# Patient Record
Sex: Male | Born: 2000
Health system: Southern US, Community
[De-identification: ages and names within clinical notes are randomized; demographics above are authoritative.]

## PROBLEM LIST (undated history)

## (undated) DIAGNOSIS — F32A Depression, unspecified: Secondary | ICD-10-CM

## (undated) HISTORY — DX: Depression, unspecified: F32.A

---
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
## 2019-01-11 IMAGING — DX DG KNEE COMPLETE 4+V*L*
4 series · 4 of 4 positions shown · non-contrast
Comparison: None.

CLINICAL DATA: Injuring left knee during wrestling match yesterday
with pain.

EXAM:
LEFT KNEE - COMPLETE 4+ VIEW

[knee ap]
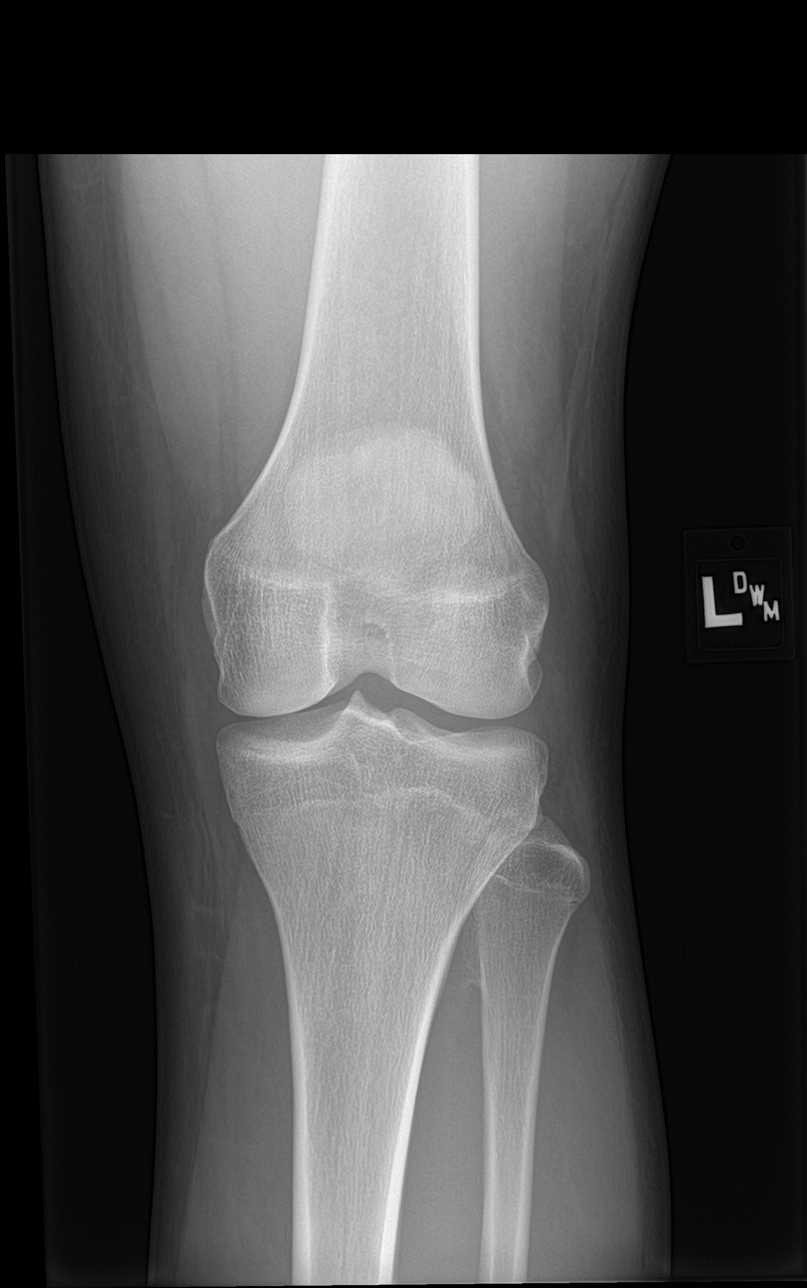

[knee lat]
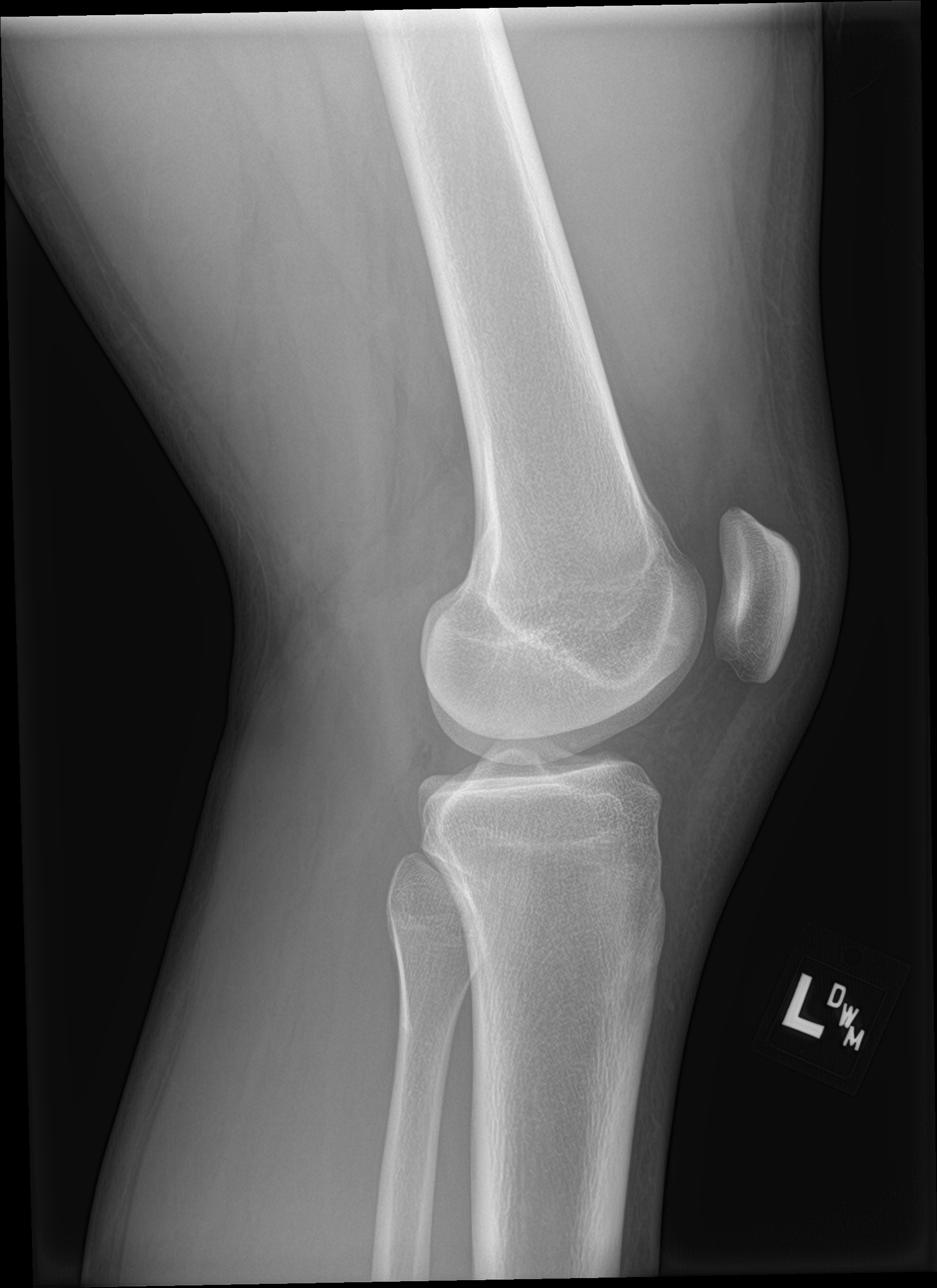

[knee obl (1 of 2)]
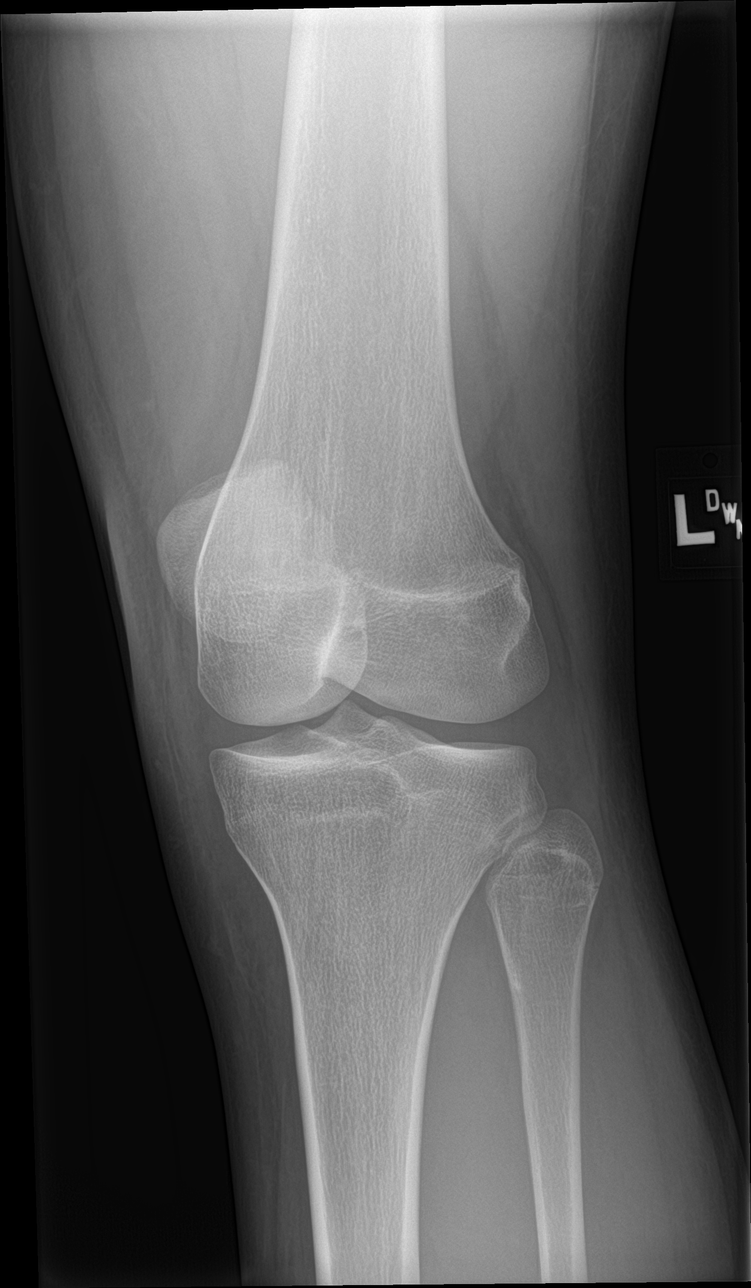

[knee obl (2 of 2)]
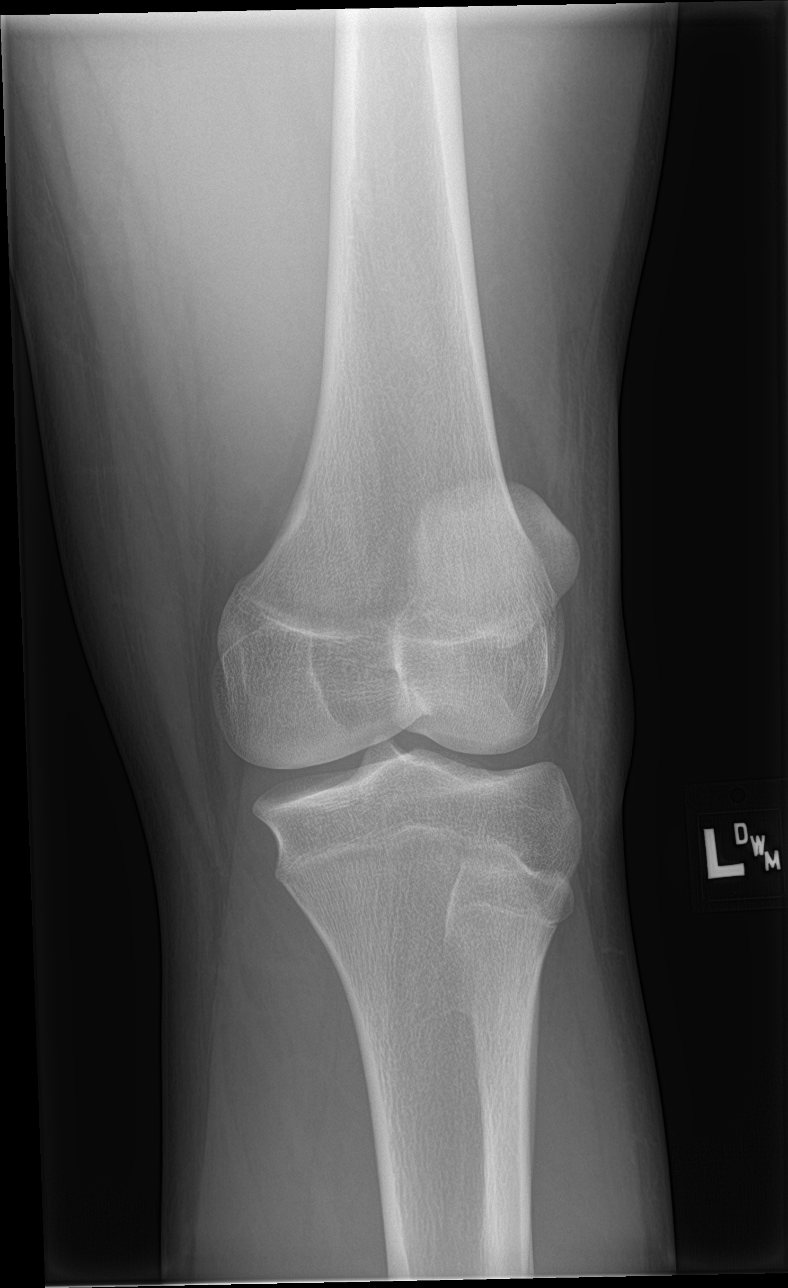

[4 of 4 positions shown; findings below may reference images not displayed]

FINDINGS: No evidence of fracture, dislocation, or joint effusion. No evidence
of arthropathy or other focal bone abnormality. Soft tissues are
unremarkable.
IMPRESSION: Negative.

## 2020-02-26 ENCOUNTER — Encounter: Payer: Self-pay | Admitting: Emergency Medicine

## 2020-02-26 ENCOUNTER — Emergency Department (INDEPENDENT_AMBULATORY_CARE_PROVIDER_SITE_OTHER)
Admission: EM | Admit: 2020-02-26 | Discharge: 2020-02-26 | Disposition: A | Discharge status: Home / Self Care | Payer: 59 | Source: Home / Self Care

## 2020-02-26 ENCOUNTER — Emergency Department: Admission: EM | Admit: 2020-02-26 | Discharge status: Absent without leave | Payer: Self-pay | Source: Home / Self Care

## 2020-02-26 DIAGNOSIS — R110 Nausea: Secondary | ICD-10-CM | POA: Diagnosis not present

## 2020-02-26 DIAGNOSIS — K529 Noninfective gastroenteritis and colitis, unspecified: Secondary | ICD-10-CM | POA: Diagnosis not present

## 2020-02-26 LAB — POCT CBC W AUTO DIFF (K'VILLE URGENT CARE)

## 2020-02-26 MED ORDER — ONDANSETRON 4 MG PO TBDP: 4.0000 mg | ORAL_TABLET | Freq: Three times a day (TID) | ORAL | 0 refills | Status: AC | PRN

## 2020-02-26 MED ORDER — OMEPRAZOLE 20 MG PO CPDR: 20.0000 mg | DELAYED_RELEASE_CAPSULE | Freq: Every day | ORAL | 0 refills | Status: AC

## 2020-02-26 NOTE — ED Triage Notes (Signed)
Patient c/o feeling nausea in the morning for the past 5 days.  Patient has not had an appetite which makes the nausea worse.  There is a burning sensation in the bottom of his stomach, no vomiting.  Patient did have a colonoscopy in April, had 2 small ulcers and inflammation.

## 2020-02-26 NOTE — ED Provider Notes (Signed)
[name]    CSN: [id number] Arrival date & time: 02/26/20  9629      History   Chief Complaint Chief Complaint  Patient presents with  . Nausea    HPI Robert [name] is a 19 y.o. male.   HPI  Robert [name] is a 19 y.o. male presenting to UC with c/o nausea that started 5 days ago and decreased appetite. Hx of chronic diarrhea. He had a colonoscopy in April 2021, found to have 2 small ulcers and inflammation but was not prescribed any medication. He was being worked up for possible IBS. Pt states due to his schedule, he has not been able to make a f/u appointment with GI. Denies fever, chills or vomiting. No urinary symptoms. No sick contacts or recent travel. He has not tried anything for his symptoms.    History reviewed. No pertinent past medical history.  There are no problems to display for this patient.   History reviewed. No pertinent surgical history.     Home Medications    Prior to Admission medications   Medication Sig Start Date End Date Taking? Authorizing Provider  omeprazole (PRILOSEC) 20 MG capsule Take 1 capsule (20 mg total) by mouth daily. 02/26/20   Lurene Shadow, PA-C  ondansetron (ZOFRAN-ODT) 4 MG disintegrating tablet Take 1 tablet (4 mg total) by mouth every 8 (eight) hours as needed for nausea or vomiting. 02/26/20   Lurene Shadow, PA-C    Family History No family history on file.  Social History Social History   Tobacco Use  . Smoking status: Never Smoker  . Smokeless tobacco: Never Used  Substance Use Topics  . Alcohol use: No  . Drug use: Not on file     Allergies   Penicillins and Sulfa antibiotics   Review of Systems Review of Systems  Constitutional: Positive for appetite change. Negative for chills, fatigue and fever.  Gastrointestinal: Positive for abdominal pain (mild diffuse cramping), diarrhea and nausea. Negative for blood in stool and vomiting.  Neurological: Negative for dizziness,  light-headedness and headaches.     Physical Exam Triage Vital Signs ED Triage Vitals [02/26/20 0954]  Enc Vitals Group     BP (!) 147/82     Pulse Rate (!) 55     Resp      Temp      Temp src      SpO2 97 %     Weight      Height      Head Circumference      Peak Flow      Pain Score 0     Pain Loc      Pain Edu?      Excl. in GC?    No data found.  Updated Vital Signs BP (!) 147/82 (BP Location: Left Arm)   Pulse (!) 55   SpO2 97%   Visual Acuity Right Eye Distance:   Left Eye Distance:   Bilateral Distance:    Right Eye Near:   Left Eye Near:    Bilateral Near:     Physical Exam Vitals and nursing note reviewed.  Constitutional:      General: He is not in acute distress.    Appearance: Normal appearance. He is well-developed. He is obese. He is not ill-appearing, toxic-appearing or diaphoretic.  HENT:     Head: Normocephalic and atraumatic.     Right Ear: Tympanic membrane and ear canal normal.     Left Ear: Tympanic membrane  and ear canal normal.     Nose: Nose normal.     Mouth/Throat:     Mouth: Mucous membranes are moist.     Pharynx: Oropharynx is clear.  Cardiovascular:     Rate and Rhythm: Regular rhythm. Bradycardia present.  Pulmonary:     Effort: Pulmonary effort is normal. No respiratory distress.     Breath sounds: Normal breath sounds. No stridor. No wheezing, rhonchi or rales.  Abdominal:     General: There is no distension.     Palpations: Abdomen is soft.     Tenderness: There is no abdominal tenderness. There is no right CVA tenderness or left CVA tenderness.  Musculoskeletal:        General: Normal range of motion.     Cervical back: Normal range of motion.  Skin:    General: Skin is warm and dry.  Neurological:     Mental Status: He is alert and oriented to person, place, and time.  Psychiatric:        Behavior: Behavior normal.      UC Treatments / Results  Labs (all labs ordered are listed, but only abnormal results  are displayed) Labs Reviewed  COMPLETE METABOLIC PANEL WITH GFR  LIPASE  POCT CBC W AUTO DIFF ([facility])    EKG   Radiology No results found.  Procedures Procedures (including critical care time)  Medications Ordered in UC Medications - No data to display  Initial Impression / Assessment and Plan / UC Course  I have reviewed the triage vital signs and the nursing notes.  Pertinent labs & imaging results that were available during my care of the patient were reviewed by me and considered in my medical decision making (see chart for details).     Benign abdominal exam CBC: unremarkable CMP and Lipase pending Will have pt try trial of omeprazole and zofran Encouraged to f/u with PCP and GI Discussed symptoms that warrant emergent care in the ED. AVS given  Final Clinical Impressions(s) / UC Diagnoses   Final diagnoses:  Nausea without vomiting  Chronic diarrhea     Discharge Instructions      Call to schedule a follow up appointment with primary care as well as gastroenterology for recheck of symptoms next week.   Call 911 or have someone drive you to the hospital if symptoms significantly worsening.     ED Prescriptions    Medication Sig Dispense Auth. Provider   omeprazole (PRILOSEC) 20 MG capsule Take 1 capsule (20 mg total) by mouth daily. 30 capsule Doroteo Glassman, Randell Detter O, PA-C   ondansetron (ZOFRAN-ODT) 4 MG disintegrating tablet Take 1 tablet (4 mg total) by mouth every 8 (eight) hours as needed for nausea or vomiting. 12 tablet Lurene Shadow, [place]     PDMP not reviewed this encounter.   Lurene Shadow, [place] 02/27/20 [phone]

## 2020-02-26 NOTE — Discharge Instructions (Signed)
  Call to schedule a follow up appointment with primary care as well as gastroenterology for recheck of symptoms next week.   Call 911 or have someone drive you to the hospital if symptoms significantly worsening.

## 2020-02-27 LAB — COMPLETE METABOLIC PANEL WITH GFR
AG Ratio: 1.8 (calc) (ref 1.0–2.5)
ALT: 62 U/L — ABNORMAL HIGH (ref 9–46)
AST: 26 U/L (ref 10–40)
Albumin: 4.9 g/dL (ref 3.6–5.1)
Alkaline phosphatase (APISO): 55 U/L (ref 36–130)
BUN: 16 mg/dL (ref 7–25)
CO2: 28 mmol/L (ref 20–32)
Calcium: 10.2 mg/dL (ref 8.6–10.3)
Chloride: 103 mmol/L (ref 98–110)
Creat: 1.12 mg/dL (ref 0.60–1.35)
GFR, Est African American: 109 mL/min/{1.73_m2} (ref >=60)
GFR, Est Non African American: 94 mL/min/{1.73_m2} (ref >=60)
Globulin: 2.7 g/dL (calc) (ref 1.9–3.7)
Glucose, Bld: 97 mg/dL (ref 65–99)
Potassium: 4.8 mmol/L (ref 3.5–5.3)
Sodium: 139 mmol/L (ref 135–146)
Total Bilirubin: 0.5 mg/dL (ref 0.2–1.2)
Total Protein: 7.6 g/dL (ref 6.1–8.1)

## 2020-02-27 LAB — LIPASE: Lipase: 11 U/L (ref 7–60)

## 2020-07-20 ENCOUNTER — Emergency Department
Admission: RE | Admit: 2020-07-20 | Discharge: 2020-07-20 | Disposition: A | Discharge status: Home / Self Care | Payer: Self-pay | Source: Ambulatory Visit | Attending: Family Medicine | Admitting: Family Medicine

## 2020-07-20 ENCOUNTER — Other Ambulatory Visit: Payer: Self-pay

## 2020-07-20 ENCOUNTER — Emergency Department (INDEPENDENT_AMBULATORY_CARE_PROVIDER_SITE_OTHER): Payer: 59

## 2020-07-20 VITALS — BP 142/86 | HR 76 | Temp 98.2°F | Resp 20 | Ht 72.0 in | Wt 270.0 lb

## 2020-07-20 DIAGNOSIS — J9801 Acute bronchospasm: Secondary | ICD-10-CM | POA: Diagnosis not present

## 2020-07-20 DIAGNOSIS — R059 Cough, unspecified: Secondary | ICD-10-CM | POA: Diagnosis not present

## 2020-07-20 MED ORDER — PREDNISONE 20 MG PO TABS: ORAL_TABLET | ORAL | 0 refills | Status: DC

## 2020-07-20 MED ORDER — ALBUTEROL SULFATE HFA 108 (90 BASE) MCG/ACT IN AERS
2.0000 | INHALATION_SPRAY | Freq: Four times a day (QID) | RESPIRATORY_TRACT | 0 refills | Status: DC | PRN
Start: 2020-07-20 — End: 2022-06-26

## 2020-07-20 NOTE — Discharge Instructions (Addendum)
Take plain guaifenesin (1200mg  extended release tabs such as Mucinex) twice daily, with plenty of water, for cough and congestion.  May continue Allegra D.

## 2020-07-20 NOTE — ED Provider Notes (Signed)
[facility]    CSN: [id number] Arrival date & time: 07/20/20  1244      History   Chief Complaint Chief Complaint  Patient presents with   Cough   Nasal Congestion    HPI Robert [name] is a 20 y.o. male.   Patient complains of a persistent partly productive cough for about a month with occasional wheezing, but no shortness of breath or pleuritic pain.  He has seasonal rhinitis which began to flare up prior to onset of his cough.  He feels well otherwise and denies fevers, chills, and sweats.  He had COVID19 in late December 2021 which had resolved by mid-January 2022. He has a past history of childhood asthma.  He has a family history of asthma (mother) and father has seasonal rhinitis.  The history is provided by the patient.    History reviewed. No pertinent past medical history.  There are no problems to display for this patient.   History reviewed. No pertinent surgical history.     Home Medications    Prior to Admission medications   Medication Sig Start Date End Date Taking? Authorizing Provider  albuterol (VENTOLIN HFA) 108 (90 Base) MCG/ACT inhaler Inhale 2 puffs into the lungs every 6 (six) hours as needed for wheezing or shortness of breath. 07/20/20  Yes Kaliel Bolds, Tera Mater, MD  dextromethorphan-guaiFENesin (MUCINEX DM) 30-600 MG 12hr tablet Take 1 tablet by mouth 2 (two) times daily as needed for cough.   Yes [provider]  predniSONE (DELTASONE) 20 MG tablet Take one tab by mouth twice daily for 4 days, then one daily for 3 days. Take with food. 07/20/20  Yes Lattie Haw, MD    Family History Family History  Problem Relation Age of Onset   Crohn's disease Mother    Healthy Father     Social History Social History   Tobacco Use   Smoking status: Never Smoker   Smokeless tobacco: Never Used  Vaping Use   Vaping Use: Former   Quit date: 10/21/2019  Substance Use Topics   Alcohol use: Not Currently   Drug use:  Yes    Frequency: 7.0 times per week    Types: Marijuana    Comment: daily     Allergies   Penicillins and Sulfa antibiotics   Review of Systems Review of Systems  No sore throat + cough No pleuritic pain + wheezing + nasal congestion + post-nasal drainage No sinus pain/pressure No itchy/red eyes No earache No hemoptysis No SOB No fever/chills No nausea No vomiting No abdominal pain No diarrhea No urinary symptoms No skin rash No fatigue No myalgias No headache   Physical Exam Triage Vital Signs ED Triage Vitals  Enc Vitals Group     BP 07/20/20 1310 (!) 142/86     Pulse Rate 07/20/20 1310 76     Resp 07/20/20 1310 20     Temp 07/20/20 1310 98.2 F (36.8 C)     Temp Source 07/20/20 1310 Oral     SpO2 07/20/20 1310 98 %     Weight 07/20/20 1307 270 lb (122.5 kg)     Height 07/20/20 1307 6' (1.829 m)     Head Circumference --      Peak Flow --      Pain Score 07/20/20 1307 0     Pain Loc --      Pain Edu? --      Excl. in [place]? --    No data found.  Updated Vital Signs BP (!) 142/86    Pulse 76    Temp 98.2 F (36.8 C) (Oral)    Resp 20    Ht 6' (1.829 m)    Wt 122.5 kg    SpO2 98%    BMI 36.62 kg/m   Visual Acuity Right Eye Distance:   Left Eye Distance:   Bilateral Distance:    Right Eye Near:   Left Eye Near:    Bilateral Near:     Physical Exam Nursing notes and Vital Signs reviewed. Appearance:  Patient appears stated age, and in no acute distress Eyes:  Pupils are equal, round, and reactive to light and accomodation.  Extraocular movement is intact.  Conjunctivae are not inflamed  Ears:  Canals normal.  Tympanic membranes normal.  Nose:  Mildly congested turbinates.  No sinus tenderness.  Pharynx:  Normal Neck:  Supple.  No adenopathy.   Lungs:  Faint bilateral rhonchi with cough.  Breath sounds are equal.  Moving air well. Heart:  Regular rate and rhythm without murmurs, rubs, or gallops.  Abdomen:  Nontender without masses or  hepatosplenomegaly.  Bowel sounds are present.  No CVA or flank tenderness.  Extremities:  No edema.  Skin:  No rash present.    UC Treatments / Results  Labs (all labs ordered are listed, but only abnormal results are displayed) Labs Reviewed - No data to display  EKG   Radiology DG Chest 2 View  Result Date: 07/20/2020 CLINICAL DATA:  Cough. EXAM: CHEST - 2 VIEW COMPARISON:  None. FINDINGS: The heart size and mediastinal contours are within normal limits. Both lungs are clear. The visualized skeletal structures are unremarkable. IMPRESSION: No active cardiopulmonary disease. Electronically Signed   By: Lupita Raider M.D.   On: 07/20/2020 14:32    Procedures Procedures (including critical care time)  Medications Ordered in UC Medications - No data to display  Initial Impression / Assessment and Plan / UC Course  I have reviewed the triage vital signs and the nursing notes.  Pertinent labs & imaging results that were available during my care of the patient were reviewed by me and considered in my medical decision making (see chart for details).    Flare-up of reactive airways disease. Benign exam. Negative chest x-ray reassuring.  Begin prednisone burst/taper. Rx written for optional albuterol MDI (add if needed). Followup with Family Doctor if not improved in about 7 to 10 days.  Final Clinical Impressions(s) / UC Diagnoses   Final diagnoses:  Bronchospasm     Discharge Instructions     Take plain guaifenesin (1200mg  extended release tabs such as Mucinex) twice daily, with plenty of water, for cough and congestion.  May continue Allegra D.   ED Prescriptions    Medication Sig Dispense Auth. Provider   predniSONE (DELTASONE) 20 MG tablet Take one tab by mouth twice daily for 4 days, then one daily for 3 days. Take with food. 11 tablet , MD   albuterol (VENTOLIN HFA) 108 (90 Base) MCG/ACT inhaler Inhale 2 puffs into the lungs every 6 (six) hours as  needed for wheezing or shortness of breath. 8 g Lattie Haw, MD        Lattie Haw, MD 07/22/20 [id number]

## 2020-07-20 NOTE — ED Triage Notes (Addendum)
Pt presents to Urgent Care with c/o productive white cough and "runny nose" x approx one month. Reports having COVID in late December--symptoms subsided by mid-January before returning a month ago. Has not been vaccinated against COVID, did receive flu shot.

## 2022-04-07 IMAGING — DX DG CHEST 2V
2 series · 2 of 2 positions shown · non-contrast
Comparison: None.

CLINICAL DATA: Cough.

EXAM:
CHEST - 2 VIEW

[chest pa]
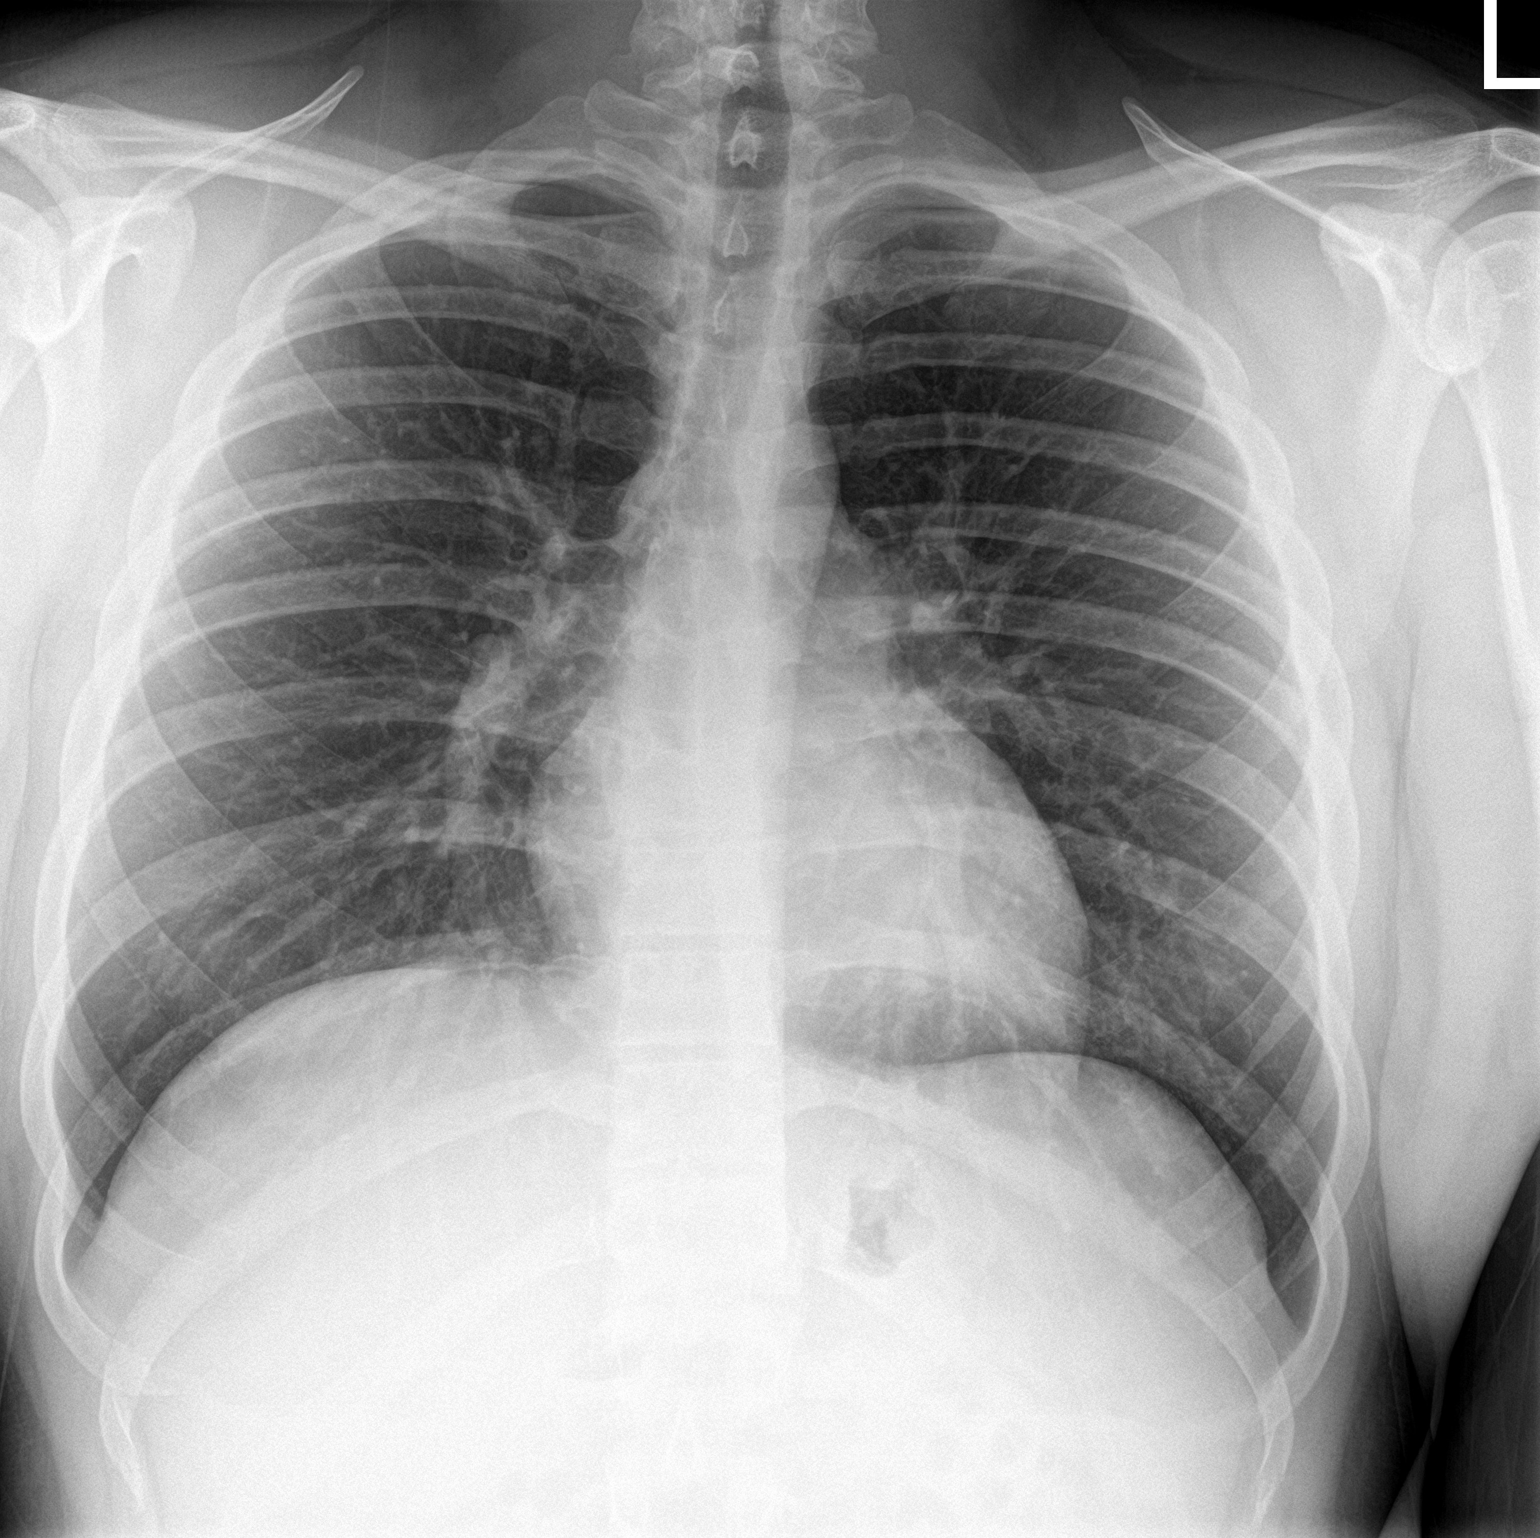

[chest lat]
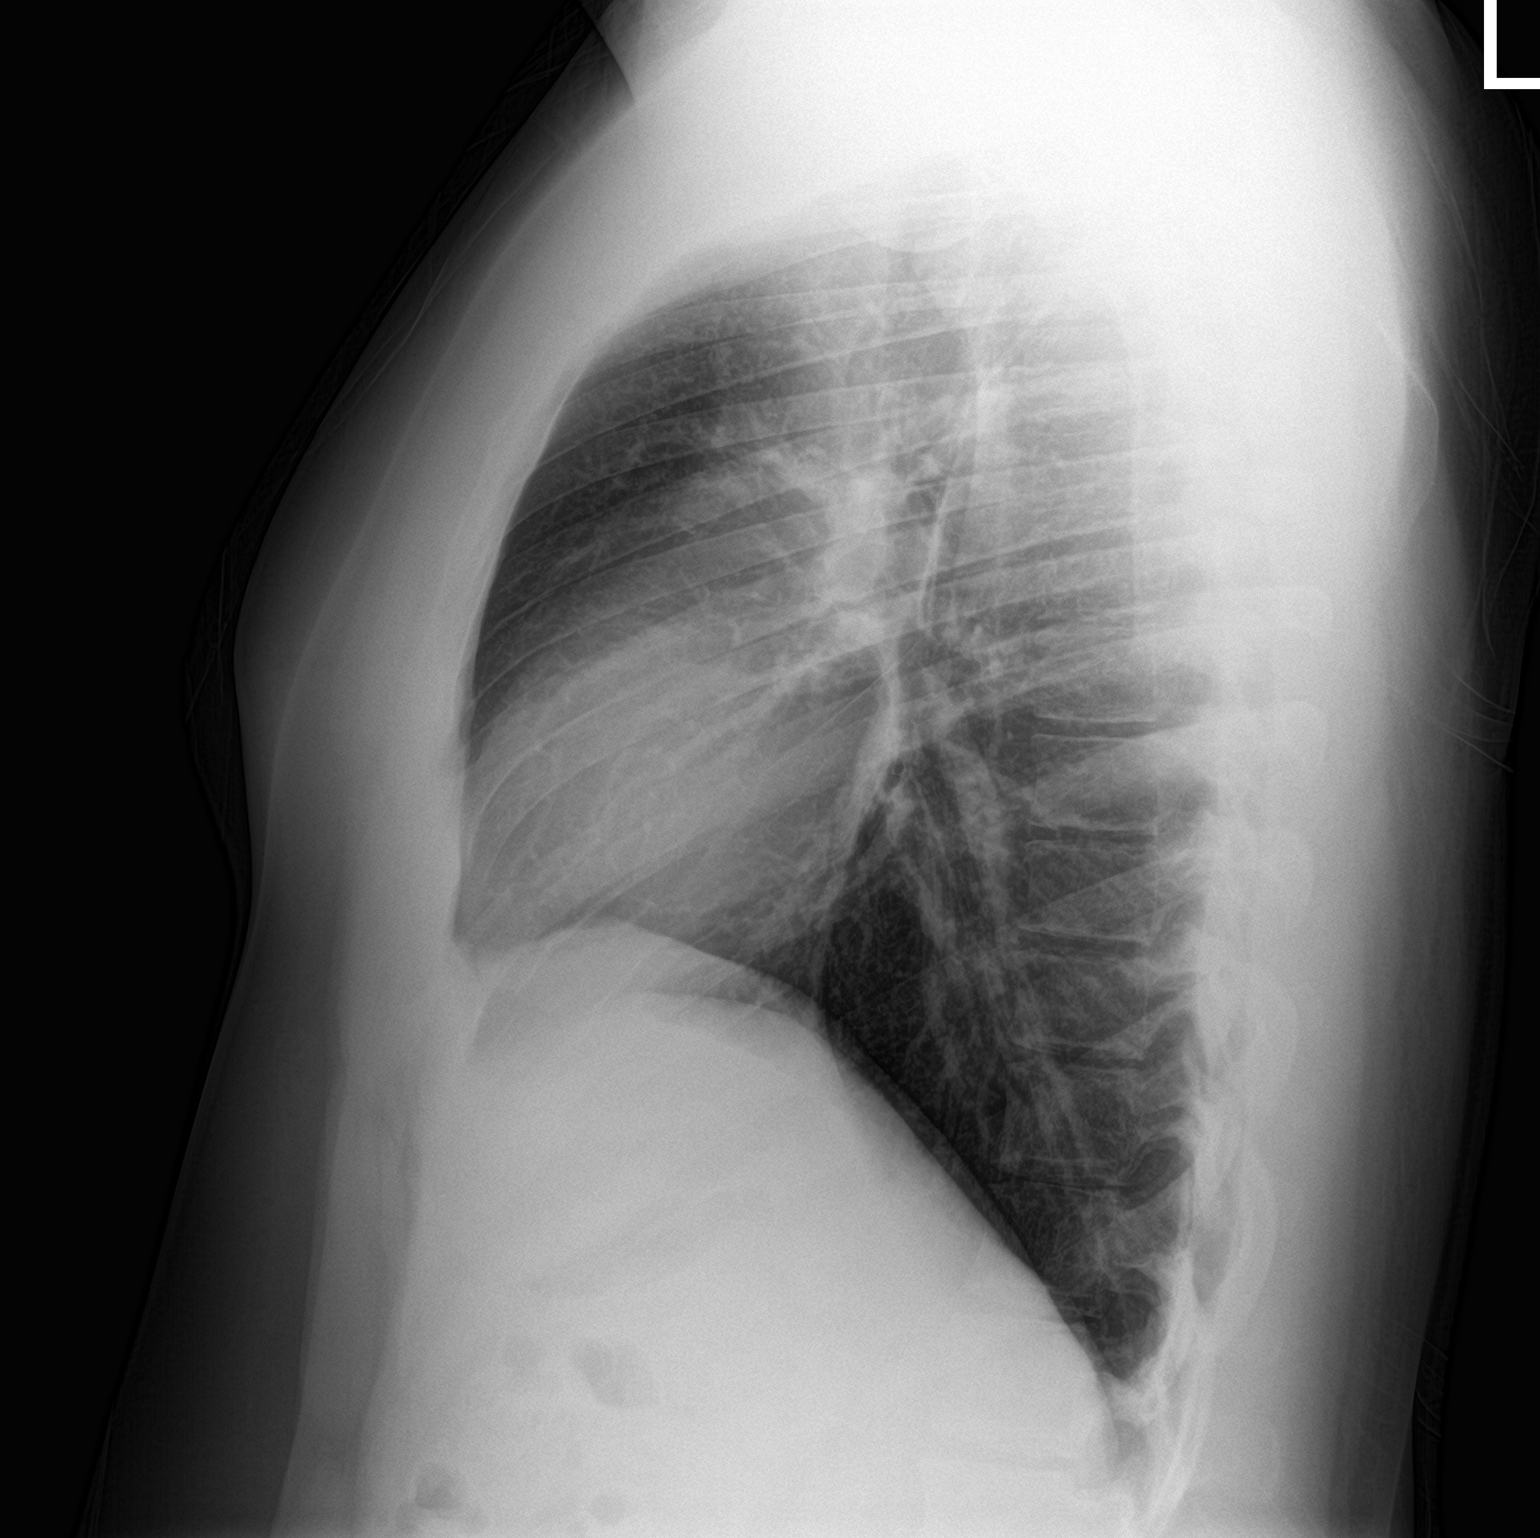

[2 of 2 positions shown; findings below may reference images not displayed]

FINDINGS: The heart size and mediastinal contours are within normal limits.
Both lungs are clear. The visualized skeletal structures are
unremarkable.
IMPRESSION: No active cardiopulmonary disease.

## 2022-06-26 ENCOUNTER — Telehealth: Payer: Self-pay | Admitting: Physician Assistant

## 2022-06-26 ENCOUNTER — Ambulatory Visit
Admission: EM | Admit: 2022-06-26 | Discharge: 2022-06-26 | Disposition: A | Discharge status: Home / Self Care | Payer: 59 | Attending: Emergency Medicine | Admitting: Emergency Medicine

## 2022-06-26 DIAGNOSIS — R062 Wheezing: Secondary | ICD-10-CM

## 2022-06-26 DIAGNOSIS — R051 Acute cough: Secondary | ICD-10-CM

## 2022-06-26 MED ORDER — ALBUTEROL SULFATE HFA 108 (90 BASE) MCG/ACT IN AERS: INHALATION_SPRAY | RESPIRATORY_TRACT | 2 refills | Status: DC

## 2022-06-26 NOTE — Telephone Encounter (Signed)
Called pt to schedule a New Patient appointment. Pt states he no longer needs an appointment.

## 2022-06-26 NOTE — Discharge Instructions (Addendum)
Albuterol inhaler - use 3x daily for the next several days Continue symptomatic care Drink lots of fluids!  Please return if symptoms persist or worsen

## 2022-06-26 NOTE — ED Triage Notes (Signed)
Pt c/o cough x 3 days. Taking dayquil/nyquil prn. Needs work note due to missing.

## 2022-06-26 NOTE — ED Provider Notes (Signed)
[facility]    CSN: [id number] Arrival date & time: 06/26/22  0950     History   Chief Complaint Chief Complaint  Patient presents with   Cough   Letter for School/Work    HPI Robert [name] is a 22 y.o. male.  Here for work note Had cough for 3 days, taking dayquil/nyquil Feeling better. Needs note to return to work tonight  No fevers Denies shortness of breath  History reviewed. No pertinent past medical history.  There are no problems to display for this patient.   History reviewed. No pertinent surgical history.    Home Medications    Prior to Admission medications   Medication Sig Start Date End Date Taking? Authorizing Provider  albuterol (VENTOLIN HFA) 108 (90 Base) MCG/ACT inhaler 2 puffs into the lungs ever 6 hours as needed 06/26/22   Arjan Strohm, Wells Guiles, PA-C  dextromethorphan-guaiFENesin (MUCINEX DM) 30-600 MG 12hr tablet Take 1 tablet by mouth 2 (two) times daily as needed for cough.    [provider]  omeprazole (PRILOSEC) 20 MG capsule Take 1 capsule (20 mg total) by mouth daily. 02/26/20   Noe Gens, PA-C  ondansetron (ZOFRAN-ODT) 4 MG disintegrating tablet Take 1 tablet (4 mg total) by mouth every 8 (eight) hours as needed for nausea or vomiting. 02/26/20   Noe Gens, PA-C    Family History Family History  Problem Relation Age of Onset   Crohn's disease Mother    Healthy Father     Social History Social History   Tobacco Use   Smoking status: Never   Smokeless tobacco: Never  Vaping Use   Vaping Use: Former   Quit date: 10/21/2019  Substance Use Topics   Alcohol use: Not Currently   Drug use: Yes    Frequency: 7.0 times per week    Types: Marijuana    Comment: daily     Allergies   Penicillins and Sulfa antibiotics   Review of Systems Review of Systems As per HPI  Physical Exam Triage Vital Signs ED Triage Vitals  Enc Vitals Group     BP      Pulse      Resp      Temp      Temp src       SpO2      Weight      Height      Head Circumference      Peak Flow      Pain Score      Pain Loc      Pain Edu?      Excl. in [place]?    No data found.  Updated Vital Signs BP (!) 145/89 (BP Location: Right Arm)   Pulse (!) 109   Temp 98 F (36.7 C) (Oral)   Resp 17   SpO2 97%    Physical Exam Vitals and nursing note reviewed.  Constitutional:      General: He is not in acute distress. HENT:     Nose: No rhinorrhea.     Mouth/Throat:     Mouth: Mucous membranes are moist.     Pharynx: Oropharynx is clear. No posterior oropharyngeal erythema.  Eyes:     Conjunctiva/sclera: Conjunctivae normal.  Cardiovascular:     Rate and Rhythm: Normal rate and regular rhythm.     Pulses: Normal pulses.     Heart sounds: Normal heart sounds.  Pulmonary:     Effort: Pulmonary effort is normal.  Breath sounds: Wheezing present.     Comments: End-expiration wheezing in all fields  Musculoskeletal:     Cervical back: Normal range of motion.  Lymphadenopathy:     Cervical: No cervical adenopathy.  Skin:    General: Skin is warm and dry.  Neurological:     Mental Status: He is alert and oriented to person, place, and time.     UC Treatments / Results  Labs (all labs ordered are listed, but only abnormal results are displayed) Labs Reviewed - No data to display  EKG  Radiology No results found.  Procedures Procedures (including critical care time)  Medications Ordered in UC Medications - No data to display  Initial Impression / Assessment and Plan / UC Course  I have reviewed the triage vital signs and the nursing notes.  Pertinent labs & imaging results that were available during my care of the patient were reviewed by me and considered in my medical decision making (see chart for details).  Afebrile, well appearing End-expiration wheezing on exam. Patient reports he has wheezing when he gets sick but doesn't feel it Sent albuterol inhaler - recommend 3x for the  next several days Continue symptomatic care Work note provided Return precautions discussed. Patient agrees to plan  Final Clinical Impressions(s) / UC Diagnoses   Final diagnoses:  Acute cough  Wheezing     Discharge Instructions      Albuterol inhaler - use 3x daily for the next several days Continue symptomatic care Drink lots of fluids!  Please return if symptoms persist or worsen      ED Prescriptions     Medication Sig Dispense Auth. Provider   albuterol (VENTOLIN HFA) 108 (90 Base) MCG/ACT inhaler 2 puffs into the lungs ever 6 hours as needed 1 each Avonell Lenig, Wells Guiles, PA-C      PDMP not reviewed this encounter.   Kess Mcilwain, Wells Guiles, [place] 06/26/22 1011

## 2022-06-26 NOTE — Telephone Encounter (Signed)
Pts father is a pt of yours, Lenton Nghiem MRN: [id number]. Pt would like to establish care with you. Is that okay?

## 2023-01-05 ENCOUNTER — Ambulatory Visit: Payer: 59 | Admitting: Family Medicine

## 2023-01-05 ENCOUNTER — Encounter: Payer: Self-pay | Admitting: Family Medicine

## 2023-01-05 VITALS — BP 143/80 | HR 62 | Ht 72.0 in | Wt 309.0 lb

## 2023-01-05 DIAGNOSIS — R197 Diarrhea, unspecified: Secondary | ICD-10-CM | POA: Diagnosis not present

## 2023-01-05 DIAGNOSIS — K219 Gastro-esophageal reflux disease without esophagitis: Secondary | ICD-10-CM | POA: Diagnosis not present

## 2023-01-05 MED ORDER — DICYCLOMINE HCL 10 MG PO CAPS: 10.0000 mg | ORAL_CAPSULE | Freq: Three times a day (TID) | ORAL | 1 refills | Status: AC

## 2023-01-05 MED ORDER — OMEPRAZOLE 20 MG PO CPDR: 20.0000 mg | DELAYED_RELEASE_CAPSULE | Freq: Every day | ORAL | 3 refills | Status: AC

## 2023-01-05 NOTE — Assessment & Plan Note (Signed)
Ordered omeprazole since patient says he has some heartburn

## 2023-01-05 NOTE — Progress Notes (Signed)
New patient visit   Patient: Robert [name]   DOB: 02-Jul-2000   22 y.o. Male  MRN: [id number] Visit Date: 01/05/2023  Today's healthcare provider: Charlton Amor, DO   Chief Complaint  Patient presents with   New Patient (Initial Visit)    Establish care    SUBJECTIVE    Chief Complaint  Patient presents with   New Patient (Initial Visit)    Establish care   HPI Pt presents to establish care. Has some abdominal concerns. He gets diarrhea regularly. Last month he had an episode of bloody diarrhea.    Review of Systems  Constitutional:  Negative for activity change, fatigue and fever.  Respiratory:  Negative for cough and shortness of breath.   Cardiovascular:  Negative for chest pain.  Gastrointestinal:  Negative for abdominal pain.  Genitourinary:  Negative for difficulty urinating.       Current Meds  Medication Sig   dicyclomine (BENTYL) 10 MG capsule Take 1 capsule (10 mg total) by mouth 3 (three) times daily before meals.   omeprazole (PRILOSEC) 20 MG capsule Take 1 capsule (20 mg total) by mouth daily.    OBJECTIVE    BP (!) 143/80   Pulse 62   Ht 6' (1.829 m)   Wt (!) 309 lb (140.2 kg)   SpO2 99%   BMI 41.91 kg/m   Physical Exam Vitals and nursing note reviewed.  Constitutional:      General: He is not in acute distress.    Appearance: Normal appearance.  HENT:     Head: Normocephalic and atraumatic.     Right Ear: External ear normal.     Left Ear: External ear normal.     Nose: Nose normal.  Eyes:     Conjunctiva/sclera: Conjunctivae normal.  Cardiovascular:     Rate and Rhythm: Normal rate and regular rhythm.  Pulmonary:     Effort: Pulmonary effort is normal.     Breath sounds: Normal breath sounds.  Abdominal:     General: Abdomen is flat. Bowel sounds are normal.     Palpations: Abdomen is soft.     Comments: Some lower quadrant abdominal pain  Neurological:     General: No focal deficit present.     Mental Status: He is  alert and oriented to person, place, and time.  Psychiatric:        Mood and Affect: Mood normal.        Behavior: Behavior normal.        Thought Content: Thought content normal.        Judgment: Judgment normal.        ASSESSMENT & PLAN    Problem List Items Addressed This Visit       Digestive   Gastroesophageal reflux disease without esophagitis    Ordered omeprazole since patient says he has some heartburn       Relevant Medications   dicyclomine (BENTYL) 10 MG capsule   omeprazole (PRILOSEC) 20 MG capsule     Other   Diarrhea - Primary    - pt reports some bloody diarrhea that happened about a month ago. Mom has crohn's disease - he does have a hx of colonoscopy 4 years ago but it is unclear what the result was - ref to GI - have also sent bentyl in for abdominal pain/spasms      Relevant Orders   Ambulatory referral to Gastroenterology    Return if symptoms worsen or fail to improve.  Meds ordered this encounter  Medications   dicyclomine (BENTYL) 10 MG capsule    Sig: Take 1 capsule (10 mg total) by mouth 3 (three) times daily before meals.    Dispense:  30 capsule    Refill:  1   omeprazole (PRILOSEC) 20 MG capsule    Sig: Take 1 capsule (20 mg total) by mouth daily.    Dispense:  30 capsule    Refill:  3    Orders Placed This Encounter  Procedures   Ambulatory referral to Gastroenterology    Referral Priority:   Routine    Referral Type:   Consultation    Referral Reason:   Specialty Services Required    Referred to Provider:   [facility]    Number of Visits Requested:   1     Charlton Amor, DO  [facility] at [facility] [phone] (phone) [phone] (fax)  [facility]

## 2023-01-05 NOTE — Assessment & Plan Note (Addendum)
-   pt reports some bloody diarrhea that happened about a month ago. Mom has crohn's disease - he does have a hx of colonoscopy 4 years ago but it is unclear what the result was - ref to GI - have also sent bentyl in for abdominal pain/spasms

## 2023-09-03 ENCOUNTER — Encounter: Payer: Self-pay | Admitting: Family Medicine
